# Patient Record
Sex: Male | Born: 2005 | Race: White | Hispanic: No | Marital: Single | Smoking: Never smoker
Health system: Southern US, Community
[De-identification: ages and names within clinical notes are randomized; demographics above are authoritative.]

## PROBLEM LIST (undated history)

## (undated) DIAGNOSIS — R41840 Attention and concentration deficit: Secondary | ICD-10-CM

## (undated) HISTORY — PX: TYMPANOSTOMY TUBE PLACEMENT: SHX32

---
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
## 2023-01-17 ENCOUNTER — Ambulatory Visit: Payer: 59 | Admitting: Mental Health

## 2023-01-17 NOTE — Progress Notes (Signed)
No chg for 1st missed session 01/17/23.

## 2023-03-23 ENCOUNTER — Ambulatory Visit: Payer: 59 | Admitting: Mental Health

## 2023-03-23 DIAGNOSIS — F3341 Major depressive disorder, recurrent, in partial remission: Secondary | ICD-10-CM | POA: Diagnosis not present

## 2023-03-23 NOTE — Progress Notes (Signed)
 [facility] Note  Name: Tanner Dean Date:  03/23/23 MRN: [id number] DOB: 07/19/2005 PCP: Marvine Rush, MD  Time spent: 50 minutes  Treatment:  Ind. therapy  Mental Status Exam:    Appearance:    Casual     Behavior:   Appropriate  Motor:   WNL  Speech/Language:    Clear and Coherent  Affect:   Full range   Mood:   Euthymic  Thought process:   Logical, linear, goal directed  Thought content:     WNL  Sensory/Perceptual disturbances:     none  Orientation:   x4  Attention:   Good  Concentration:   Good  Memory:   Intact  Fund of knowledge:    Consistent with age and development  Insight:     Good  Judgment:    Good  Impulse Control:   Good     Reported Symptoms:  adhd sx's- diagnosed   Risk Assessment: Danger to Self:  No Self-injurious Behavior: No Danger to Others: No Duty to Warn: His Physical Aggression / Violence:No  Access to Firearms a concern: No  Gang Involvement:No  Patient / guardian was making these mannerism about steps to take if suicide or homicide risk level increases between visits: yes While future psychiatric events cannot be accurately predicted, the patient does not currently require acute inpatient psychiatric care and does not currently meet [place]  involuntary commitment criteria.   Medications: No current outpatient medications on file.   No current facility-administered medications for this visit.    Allergies  Allergen Reactions   Other     Cats    Diagnoses:    ICD-10-CM   1. MDD (major depressive disorder), recurrent, in partial remission (HCC)  F33.41       Subjective: Patient arrived on time for today's session.  It has been approximately 2-1/2 months since his last visit. Patient reports he wants to join the [employer]. He recently went to [facility] and was told to return to mental health for an assessment to determine readiness for service.  Patient reports his mood has continued to be stable, had a  pleasant holiday with family.  He stated that he has perspective about his past issues, namely his feeling depressed a few months ago which led to his inpatient psychiatric stay in September.  He identified how he feels that the combination of taking his Adderall for a couple of months prior to going inpatient as well as his girlfriend becoming more distant and eventually breaking up with him as catalysts or his depressed mood and suicidality at that time.  He continues to deny any suicidal ideation since that time, expressing insight into how he had difficulty regulating his emotions due to the break-up.  He feels he has learned more about himself, albeit feels it was difficult to go through at that time.  He expresses hopefulness and optimism about his future currently, looks forward to the opportunity of serving in [employer], has future oriented goals related to how he can serve as well as getting the skills needed for him to eventually secure a career. He denies any depressive symptoms, denies wanting to take any medication.   Interventions: Further assessment, supportive therapy, motivational interviewing  Plan: Patient is to use coping skills as identified in session.  Patient to utilize his support system as needed.  Long-term goal:  Reduce overall level, frequency, and intensity of the feelings of depression for at least 3 consecutive months.  Short-term goal:  To identify and process feelings related to the disappointment of past painful events that increase feelings of sadness.                   Identify thoughts that contribute to depressive episodes and work to reframe.                              Identify healthy coping, outlets for stress management.  Assessment of progress:  progressing   Lonni Fischer, [facility]

## 2023-04-06 ENCOUNTER — Ambulatory Visit: Payer: 59 | Admitting: Mental Health

## 2023-04-06 DIAGNOSIS — F3341 Major depressive disorder, recurrent, in partial remission: Secondary | ICD-10-CM | POA: Diagnosis not present

## 2023-04-06 NOTE — Progress Notes (Signed)
[facility] Psychotherapy Note  Name: Tanner Dean Date:  04/06/23 MRN: [id number] DOB: 30-Sep-2005 PCP: Assunta Found, MD  Time spent: 48 minutes  Treatment:  Ind. therapy  Mental Status Exam:    Appearance:    Casual     Behavior:   Appropriate  Motor:   WNL  Speech/Language:    Clear and Coherent  Affect:   Full range   Mood:   Euthymic  Thought process:   Logical, linear, goal directed  Thought content:     WNL  Sensory/Perceptual disturbances:     none  Orientation:   x4  Attention:   Good  Concentration:   Good  Memory:   Intact  Fund of knowledge:    Consistent with age and development  Insight:     Good  Judgment:    Good  Impulse Control:   Good     Reported Symptoms:  adhd sx's- diagnosed   Risk Assessment: Danger to Self:  No Self-injurious Behavior: No Danger to Others: No Duty to Warn: His Physical Aggression / Violence:No  Access to Firearms a concern: No  Gang Involvement:No  Patient / guardian was making these mannerism about steps to take if suicide or homicide risk level increases between visits: yes While future psychiatric events cannot be accurately predicted, the patient does not currently require acute inpatient psychiatric care and does not currently meet [facility] involuntary commitment criteria.   Medications: No current outpatient medications on file.   No current facility-administered medications for this visit.    Allergies  Allergen Reactions   Other     Cats    Diagnoses:    ICD-10-CM   1. MDD (major depressive disorder), recurrent, in partial remission (HCC)  F33.41        Subjective: Patient arrived on time for today's session.  Assessed events over the last couple of weeks since his last session.  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  He stated that he has been at his job for the past year, sharing experiences  and skills he has obtained.  Assess his mood where he stated he has been feeling "good".  Further explored family relationships, his grandmother continuing to reside with them for the past 2 years.  He stated this has caused some strain in some family relationships going on to share some details.  He shared how he set some boundaries with his grandmother and more about their own relationship over the years.  Outlets for enjoyment were explored for patient shared he has some friendships which are meaningful.  He continues to express hopefulness about joining the [employer].  Interventions: Further assessment, supportive therapy, motivational interviewing  Plan: Patient is to use coping skills as identified in session.  Patient to utilize his support system as needed.  Long-term goal:  Reduce overall level, frequency, and intensity of the feelings of depression for at least 3 consecutive months.  Short-term goal: To identify and process feelings related to the disappointment of past painful events that increase feelings of sadness.                   Identify thoughts that contribute to depressive episodes and work to reframe.                              Identify healthy  coping, outlets for stress management.  Assessment of progress:  progressing   Waldron Session, [facility]

## 2023-04-14 ENCOUNTER — Ambulatory Visit (INDEPENDENT_AMBULATORY_CARE_PROVIDER_SITE_OTHER): Payer: 59 | Admitting: Mental Health

## 2023-04-14 DIAGNOSIS — F3342 Major depressive disorder, recurrent, in full remission: Secondary | ICD-10-CM | POA: Diagnosis not present

## 2023-04-14 DIAGNOSIS — F902 Attention-deficit hyperactivity disorder, combined type: Secondary | ICD-10-CM

## 2023-04-14 NOTE — Progress Notes (Signed)
[facility] Psychotherapy Note  Name: Tanner Dean Date:  04/14/23 MRN: [id number] DOB: Mar 25, 2005 PCP: Assunta Found, MD  Time spent: 48 minutes  Treatment:  Ind. therapy  Mental Status Exam:    Appearance:    Casual     Behavior:   Appropriate  Motor:   WNL  Speech/Language:    Clear and Coherent  Affect:   Full range   Mood:   Euthymic  Thought process:   Logical, linear, goal directed  Thought content:     WNL  Sensory/Perceptual disturbances:     none  Orientation:   x4  Attention:   Good  Concentration:   Good  Memory:   Intact  Fund of knowledge:    Consistent with age and development  Insight:     Good  Judgment:    Good  Impulse Control:   Good     Reported Symptoms: (History of ADHD diagnosis ) problems with focus and attention, distractibility.   Risk Assessment: Danger to Self:  No Self-injurious Behavior: No Danger to Others: No Duty to Warn: His Physical Aggression / Violence:No  Access to Firearms a concern: No  Gang Involvement:No  Patient / guardian was making these mannerism about steps to take if suicide or homicide risk level increases between visits: yes While future psychiatric events cannot be accurately predicted, the patient does not currently require acute inpatient psychiatric care and does not currently meet [facility] involuntary commitment criteria.   Medications: No current outpatient medications on file.   No current facility-administered medications for this visit.    Allergies  Allergen Reactions   Other     Cats    Diagnoses:    ICD-10-CM   1. MDD (major depressive disorder), recurrent, in full remission (HCC)  F33.42     2. Attention deficit hyperactivity disorder (ADHD), combined type  F90.2         Subjective: Patient arrived on time for today's session.  Assessed progress and recent events. He identified some stress related to being answer of his future career path. He continues to work his  full-time job but knows at some point he wants a different occupation. He stated his father an auto  repair business but wants to choose a different profession for himself. He went on to share his work history beginning in adolescence, working full-time hours and often over time while in high school. He said that he's had some problems with sleep at times, waking in the middle of the night having difficulty going back to sleep. He denies all other symptoms of depression, he scored a "1" on the PHQ-9, indicating only challenges with his sleep.  Encouraged a consistent sleep schedule.    Interventions: Further assessment, supportive therapy, motivational interviewing  Plan: Patient is to use coping skills as identified in session.  Patient to utilize his support system as needed.  Long-term goal:  Reduce overall level, frequency, and intensity of the feelings of depression for at least 3 consecutive months.  Short-term goal: To identify and process feelings related to the disappointment of past painful events that increase feelings of sadness.                   Identify thoughts that contribute to depressive episodes and work to reframe.                              Identify healthy coping, outlets for  stress management.  Assessment of progress:  progressing   Waldron Session, [facility]

## 2023-05-06 ENCOUNTER — Ambulatory Visit: Payer: 59 | Admitting: Mental Health

## 2023-05-06 NOTE — Progress Notes (Signed)
No charge for missed sessoin 05/06/23.

## 2023-05-12 ENCOUNTER — Ambulatory Visit (INDEPENDENT_AMBULATORY_CARE_PROVIDER_SITE_OTHER): Payer: 59 | Admitting: Mental Health

## 2023-05-12 DIAGNOSIS — F4322 Adjustment disorder with anxiety: Secondary | ICD-10-CM | POA: Diagnosis not present

## 2023-05-12 DIAGNOSIS — F902 Attention-deficit hyperactivity disorder, combined type: Secondary | ICD-10-CM

## 2023-05-12 NOTE — Progress Notes (Signed)
 [facility] Psych[facility] Note  Name: Tanner Dean Date:  05/12/23 MRN: [id number] DOB: 01-16-2006 PCP: Assunta Found, MD  Time spent: 46 minutes  Treatment:  Ind. therapy  Mental Status Exam:    Appearance:    Casual     Behavior:   Appropriate  Motor:   WNL  Speech/Language:    Clear and Coherent  Affect:   Full range   Mood:   Euthymic  Thought process:   Logical, linear, goal directed  Thought content:     WNL  Sensory/Perceptual disturbances:     none  Orientation:   x4  Attention:   Good  Concentration:   Good  Memory:   Intact  Fund of knowledge:    Consistent with age and development  Insight:     Good  Judgment:    Good  Impulse Control:   Good     Reported Symptoms: (History of ADHD diagnosis ) problems with focus and attention, distractibility.   Risk Assessment: Danger to Self:  No Self-injurious Behavior: No Danger to Others: No Duty to Warn: His Physical Aggression / Violence:No  Access to Firearms a concern: No  Gang Involvement:No  Patient / guardian was making these mannerism about steps to take if suicide or homicide risk level increases between visits: yes While future psychiatric events cannot be accurately predicted, the patient does not currently require acute inpatient psychiatric care and does not currently meet [facility] involuntary commitment criteria.   Medications: No current outpatient medications on file.   No current facility-administered medications for this visit.    Allergies  Allergen Reactions   Other     Cats    Diagnoses:    ICD-10-CM   1. Adjustment disorder with anxious mood  F43.22     2. Attention deficit hyperactivity disorder (ADHD), combined type  F90.2          Subjective: Patient arrived for today's session.  Assessed progress where he stated that he has been coping with a lot over the last few weeks.  He stated that his grandfather is having to engage in treatment for his cancer, his  grandmother also having medical issues requiring her to be hospitalized.  He stated that his mother appears to be coping with medical diagnosis, however, he is unaware of any specifics as she, nor his father has informed him of anything as of yet.  He stated that he can ask his mother but it has not been something he is felt the need to do at this point.  He stated his dog suffered a recent injury and is having complex medical needs and is worried about his health.  Provide support and encouragement, further facilitating patient acknowledging how he is work to problem solve recent stressors such as taking his dog to the vet to get examined.  He also set a boundary with some friendships recently as he felt this was the name due to their use of substances; patient shared how he wants to avoid using drugs or alcohol which is why he has chosen to set this boundary.    Interventions: supportive therapy, motivational interviewing, CBT  Plan: Patient is to use coping skills as identified in session.  Patient to utilize his support system as needed.  Long-term goal:  Reduce overall level, frequency, and intensity of the feelings of depression for at least 3 consecutive months.  Short-term goal: To identify and process feelings related to the disappointment of past painful events that increase feelings  of sadness.                   Identify thoughts that contribute to depressive episodes, anxiety and work to reframe.                              Identify healthy coping, outlets for stress management.  Assessment of progress:  progressing   Waldron Session, [facility]

## 2023-06-02 ENCOUNTER — Ambulatory Visit: Payer: 59 | Admitting: Mental Health

## 2023-06-02 DIAGNOSIS — F902 Attention-deficit hyperactivity disorder, combined type: Secondary | ICD-10-CM | POA: Diagnosis not present

## 2023-06-02 NOTE — Progress Notes (Signed)
 [facility] Psychotherapy Note  Name: Tanner Dean Date:  06/02/23 MRN: [id number] DOB: 07-18-2005 PCP: Assunta Found, MD  Time spent: 47 minutes  Treatment:  Ind. therapy  Mental Status Exam:    Appearance:    Casual     Behavior:   Appropriate  Motor:   WNL  Speech/Language:    Clear and Coherent  Affect:   Full range   Mood:   Euthymic  Thought process:   Logical, linear, goal directed  Thought content:     WNL  Sensory/Perceptual disturbances:     none  Orientation:   x4  Attention:   Good  Concentration:   Good  Memory:   Intact  Fund of knowledge:    Consistent with age and development  Insight:     Good  Judgment:    Good  Impulse Control:   Good     Reported Symptoms: (History of ADHD diagnosis ) problems with focus and attention, distractibility.   Risk Assessment: Danger to Self:  No Self-injurious Behavior: No Danger to Others: No Duty to Warn: His Physical Aggression / Violence:No  Access to Firearms a concern: No  Gang Involvement:No  Patient / guardian was making these mannerism about steps to take if suicide or homicide risk level increases between visits: yes While future psychiatric events cannot be accurately predicted, the patient does not currently require acute inpatient psychiatric care and does not currently meet [facility] involuntary commitment criteria.   Medications: No current outpatient medications on file.   No current facility-administered medications for this visit.    Allergies  Allergen Reactions   Other     Cats    Diagnoses:    ICD-10-CM   1. Attention deficit hyperactivity disorder (ADHD), combined type  F90.2        Subjective: Patient arrived for today's session.  He shared progress, how [place]  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  Patient was reminded that he has time to make this decision, how he has identified this in previous sessions.  He does admit having a sense of pressure, wants to feel that he has more of a life direction in terms of career but remains uncertain.  He identified wanting to be more effective with being on time for his job, not sleeping in the mornings which he can do often.  He stated that he struggled with this over the past couple of years although he had a history of a strong work ethic and wants this to be more present in his life again.  Discussed some coping strategies, behavioral interventions to increase consistency to get up in a timely manner and decrease procrastination.   Interventions: supportive therapy, motivational interviewing, CBT  Plan: Patient is to use coping skills as identified in session.  Patient to utilize his support system as needed.  Long-term goal:  Reduce overall level, frequency, and intensity of the feelings of depression for at least 3 consecutive months.  Short-term goal: To identify and process feelings related to the disappointment of past painful events that increase feelings of sadness.                   Identify thoughts that contribute to depressive episodes, anxiety and  work to reframe.                              Identify healthy coping, outlets for stress management.    Waldron Session, [facility]

## 2023-06-23 ENCOUNTER — Ambulatory Visit: Admitting: Mental Health

## 2023-06-23 NOTE — Progress Notes (Signed)
 No charge for missed session 06/23/23

## 2023-07-14 ENCOUNTER — Ambulatory Visit: Admitting: Mental Health

## 2023-07-14 ENCOUNTER — Ambulatory Visit (INDEPENDENT_AMBULATORY_CARE_PROVIDER_SITE_OTHER): Admitting: Mental Health

## 2023-07-14 DIAGNOSIS — F902 Attention-deficit hyperactivity disorder, combined type: Secondary | ICD-10-CM | POA: Diagnosis not present

## 2023-07-14 NOTE — Progress Notes (Signed)
 [facility] Psychotherapy Note  Name: Tanner Dean Date: 07/14/2023 MRN: [id number] DOB: 2006-01-05 PCP: Minus Amel, MD  Time spent: 48 minutes  Treatment:  Ind. therapy  Mental Status Exam:    Appearance:    Casual     Behavior:   Appropriate  Motor:   WNL  Speech/Language:    Clear and Coherent  Affect:   Full range   Mood:   Euthymic  Thought process:   Logical, linear, goal directed  Thought content:     WNL  Sensory/Perceptual disturbances:     none  Orientation:   x4  Attention:   Good  Concentration:   Good  Memory:   Intact  Fund of knowledge:    Consistent with age and development  Insight:     Good  Judgment:    Good  Impulse Control:   Good     Reported Symptoms: (History of ADHD diagnosis ) problems with focus and attention, distractibility.   Risk Assessment: Danger to Self:  No Self-injurious Behavior: No Danger to Others: No Duty to Warn: His Physical Aggression / Violence:No  Access to Firearms a concern: No  Gang Involvement:No  Patient / guardian was making these mannerism about steps to take if suicide or homicide risk level increases between visits: yes While future psychiatric events cannot be accurately predicted, the patient does not currently require acute inpatient psychiatric care and does not currently meet [place]  involuntary commitment criteria.   Medications: No current outpatient medications on file.   No current facility-administered medications for this visit.    Allergies  Allergen Reactions   Other     Cats    Diagnoses:    ICD-10-CM   1. Attention deficit hyperactivity disorder (ADHD), combined type  F90.2         Subjective: Patient arrived for today's session.  Assessed progress since last visit.  He shared pleasant time with family going on a vacation recently.  He shared positive interactions with family, reports also he continues to have support from his friends, making a friend at work  recently and continuing a friendship with one of his close friends over the years.  He went on to share other positive changes, that he and his girlfriend got back together about 3 months ago, reports the relationship is going well and shared how their communication has continued to improve.  He stated they dated for 2 years prior to their break-up last year going on to share some relational dynamics as well as with her family.  He continues to deny feelings of depression, is managing stress well, exercises, plays pickle ball a few days a week and continues to work at his [employer].  He has been able to improve his sleep patterns, getting about 8 hours per night and going to bed earlier as discussed last session following through with ways to manage his schedule effectively.  He expresses optimism about his future, continues to consider joining the [employer], feels this will give him skills and direction.  Due to his continued progress in meeting his goals, plan to stop therapy at this time.  Made patient aware he could contact our office for future sessions if needed.   Interventions: supportive therapy, motivational interviewing   Plan: Patient to contact our office for future sessions if needed.   Avram Lenis, [facility]

## 2024-01-10 ENCOUNTER — Ambulatory Visit: Admitting: Mental Health

## 2024-01-10 DIAGNOSIS — F902 Attention-deficit hyperactivity disorder, combined type: Secondary | ICD-10-CM

## 2024-01-10 DIAGNOSIS — F3342 Major depressive disorder, recurrent, in full remission: Secondary | ICD-10-CM | POA: Diagnosis not present

## 2024-01-10 NOTE — Progress Notes (Signed)
 [facility] Psychotherapy Note  Name: Tanner Dean Date: 07/14/2023 MRN: [id number] DOB: 09/15/2005 PCP: Marvine Rush, MD  Time spent: 41 minutes  Treatment:  Ind. therapy  Mental Status Exam:    Appearance:    Casual     Behavior:   Appropriate  Motor:   WNL  Speech/Language:    Clear and Coherent  Affect:   Full range   Mood:   Euthymic  Thought process:   Logical, linear, goal directed  Thought content:     WNL  Sensory/Perceptual disturbances:     none  Orientation:   x4  Attention:   Good  Concentration:   Good  Memory:   Intact  Fund of knowledge:    Consistent with age and development  Insight:     Good  Judgment:    Good  Impulse Control:   Good     Reported Symptoms: (History of ADHD diagnosis ) problems with focus and attention, distractibility.   Risk Assessment: Danger to Self:  No Self-injurious Behavior: No Danger to Others: No Duty to Warn: His Physical Aggression / Violence:No  Access to Firearms a concern: No  Gang Involvement:No  Patient / guardian was making these mannerism about steps to take if suicide or homicide risk level increases between visits: yes While future psychiatric events cannot be accurately predicted, the patient does not currently require acute inpatient psychiatric care and does not currently meet [place]  involuntary commitment criteria.   Medications: No current outpatient medications on file.   No current facility-administered medications for this visit.    Allergies  Allergen Reactions   Other     Cats    Diagnoses:    ICD-10-CM   1. Attention deficit hyperactivity disorder (ADHD), combined type  F90.2     2. MDD (major depressive disorder), recurrent, in full remission  F33.42          Subjective: Patient arrived for today's session.  Patient shared plans.  He stated he wants to join the [employer] and is hopeful that he will be able to enlist in the coming weeks.  He stated that he has a position  in mind and is hopeful that he will be able to serve.  Assess his mood where he stated that he has been feeling good.  Denies feeling depressed, shared how he has made many positive changes.  He stated that he works out about 6 days/week, continues to work part-time and has made several new friendships over the past several months.  He stated that he broke up with his girlfriend earlier this year and shared how he feels confident this was the best decision for both of them.  He expressed motivation and enjoyment in activities, plays in a pickleball league, sharing experiences.  Administered PHQ-9 where he scored a 0.  Due to ongoing progress in maintaining no symptoms of depression, it was agreed that he will no longer need further sessions at this time. Made patient aware he could contact our office for future sessions if needed.  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   Interventions: supportive therapy, motivational interviewing   Plan: Patient to contact our office for future sessions if needed.   Lonni Fischer, [facility]
